# Patient Record
Sex: Female | Born: 1960 | Race: White | Hispanic: No | Marital: Single | Smoking: Current every day smoker
Health system: Southern US, Community
[De-identification: ages and names within clinical notes are randomized; demographics above are authoritative.]

## PROBLEM LIST (undated history)

## (undated) DIAGNOSIS — S42309A Unspecified fracture of shaft of humerus, unspecified arm, initial encounter for closed fracture: Secondary | ICD-10-CM

## (undated) DIAGNOSIS — E119 Type 2 diabetes mellitus without complications: Secondary | ICD-10-CM

## (undated) DIAGNOSIS — I10 Essential (primary) hypertension: Secondary | ICD-10-CM

## (undated) DIAGNOSIS — E1340 Other specified diabetes mellitus with diabetic neuropathy, unspecified: Secondary | ICD-10-CM

## (undated) HISTORY — PX: CYST EXCISION: SHX5701

## (undated) HISTORY — PX: ABDOMINAL HYSTERECTOMY: SHX81

---

## 2016-11-16 DIAGNOSIS — S42309A Unspecified fracture of shaft of humerus, unspecified arm, initial encounter for closed fracture: Secondary | ICD-10-CM

## 2016-11-16 HISTORY — DX: Unspecified fracture of shaft of humerus, unspecified arm, initial encounter for closed fracture: S42.309A

## 2016-11-26 ENCOUNTER — Emergency Department (HOSPITAL_COMMUNITY)
Admission: EM | Admit: 2016-11-26 | Discharge: 2016-11-26 | Disposition: A | Discharge status: Home / Self Care | Payer: Medicare Other | Source: Home / Self Care | Attending: Emergency Medicine | Admitting: Emergency Medicine

## 2016-11-26 ENCOUNTER — Encounter (HOSPITAL_COMMUNITY): Payer: Self-pay | Admitting: *Deleted

## 2016-11-26 ENCOUNTER — Emergency Department (HOSPITAL_COMMUNITY): Payer: Medicare Other

## 2016-11-26 DIAGNOSIS — E1100 Type 2 diabetes mellitus with hyperosmolarity without nonketotic hyperglycemic-hyperosmolar coma (NKHHC): Secondary | ICD-10-CM | POA: Diagnosis not present

## 2016-11-26 DIAGNOSIS — I10 Essential (primary) hypertension: Secondary | ICD-10-CM

## 2016-11-26 DIAGNOSIS — S42291A Other displaced fracture of upper end of right humerus, initial encounter for closed fracture: Secondary | ICD-10-CM | POA: Insufficient documentation

## 2016-11-26 DIAGNOSIS — E119 Type 2 diabetes mellitus without complications: Secondary | ICD-10-CM

## 2016-11-26 DIAGNOSIS — W01198A Fall on same level from slipping, tripping and stumbling with subsequent striking against other object, initial encounter: Secondary | ICD-10-CM | POA: Insufficient documentation

## 2016-11-26 DIAGNOSIS — F17200 Nicotine dependence, unspecified, uncomplicated: Secondary | ICD-10-CM | POA: Insufficient documentation

## 2016-11-26 DIAGNOSIS — Y939 Activity, unspecified: Secondary | ICD-10-CM

## 2016-11-26 DIAGNOSIS — Y999 Unspecified external cause status: Secondary | ICD-10-CM

## 2016-11-26 DIAGNOSIS — Y9289 Other specified places as the place of occurrence of the external cause: Secondary | ICD-10-CM

## 2016-11-26 DIAGNOSIS — R739 Hyperglycemia, unspecified: Secondary | ICD-10-CM | POA: Diagnosis not present

## 2016-11-26 HISTORY — DX: Type 2 diabetes mellitus without complications: E11.9

## 2016-11-26 HISTORY — DX: Essential (primary) hypertension: I10

## 2016-11-26 MED ORDER — HYDROCODONE-ACETAMINOPHEN 5-325 MG PO TABS: 1.0000 | ORAL_TABLET | ORAL | 0 refills | Status: DC | PRN

## 2016-11-26 MED ORDER — HYDROCODONE-ACETAMINOPHEN 5-325 MG PO TABS: 1.0000 | ORAL_TABLET | ORAL | 0 refills | Status: AC | PRN

## 2016-11-26 NOTE — ED Notes (Signed)
Patient given a prepackage of Norco 5/325 mg quantity six and instructions on use, patient verbally understands.

## 2016-11-26 NOTE — ED Provider Notes (Signed)
[facility]-EMERGENCY DEPT Provider Note   CSN: [id number] Arrival date & time: 11/26/16  2140     History   Chief Complaint Chief Complaint  Patient presents with  . Shoulder Pain    HPI Ashley Huff is a 56 y.o. right handed female presenting with injury to her right shoulder occurring just prior to arrival tonight.  She slipped on the top step of a wooden deck and fell onto the deck surface with her right arm behind her head.  She has had persistent pain in the right shoulder since.  There is no radiation of pain and she denies head or neck injury or pain.  She had no loc during the event and denies weakness or numbness in her right arm or hand. She has had no treatment prior to arriving here.  The history is provided by the patient and a relative.    Past Medical History:  Diagnosis Date  . Diabetes mellitus without complication (HCC)   . Hypertension     There are no active problems to display for this patient.   Past Surgical History:  Procedure Laterality Date  . ABDOMINAL HYSTERECTOMY      OB History    No data available       Home Medications    Prior to Admission medications   Medication Sig Start Date End Date Taking? Authorizing Provider  HYDROcodone-acetaminophen (NORCO/VICODIN) 5-325 MG tablet Take 1 tablet by mouth every 4 (four) hours as needed. 11/26/16   Burgess AmorIdol, Knoah Nedeau, PA-C    Family History No family history on file.  Social History Social History  Substance Use Topics  . Smoking status: Current Every Day Smoker  . Smokeless tobacco: Never Used  . Alcohol use No     Allergies   Patient has no known allergies.   Review of Systems Review of Systems  Constitutional: Negative for fever.  Musculoskeletal: Positive for arthralgias. Negative for back pain, joint swelling, myalgias and neck pain.  Neurological: Negative for weakness, numbness and headaches.     Physical Exam Updated Vital Signs BP (!) 145/68   Pulse (!) 116   Temp 98.3 F  (36.8 C) (Oral)   Resp 16   Ht 5\' 2"  (1.575 m)   Wt 84.4 kg (186 lb)   SpO2 95%   BMI 34.02 kg/m   Physical Exam  Constitutional: She appears well-developed and well-nourished.  HENT:  Head: Normocephalic and atraumatic.  Neck: Normal range of motion.  Cardiovascular:  Pulses:      Radial pulses are 2+ on the right side, and 2+ on the left side.  Pulses equal bilaterally  Musculoskeletal: She exhibits tenderness.       Right shoulder: She exhibits bony tenderness. She exhibits no swelling, no effusion, no crepitus, no spasm, normal pulse and normal strength.       Cervical back: Normal.       Thoracic back: Normal.       Lumbar back: Normal.  ttp right lateral and anterior shoulder, no deformity.  Bicep nontender, no right elbow or forearm pain.   Neurological: She is alert. She has normal strength. She displays normal reflexes. No sensory deficit.  Equal grip strength.  Skin: Skin is warm and dry.  Psychiatric: She has a normal mood and affect.     ED Treatments / Results  Labs (all labs ordered are listed, but only abnormal results are displayed) Labs Reviewed - No data to display  EKG  EKG Interpretation None  Radiology Dg Shoulder Right  Result Date: 11/26/2016 CLINICAL DATA:  Status post fall, with right shoulder pain. Initial encounter. EXAM: RIGHT SHOULDER - 2+ VIEW COMPARISON:  None. FINDINGS: There is a minimally displaced fracture involving the right humeral head and neck. Degenerative change is noted at the right acromioclavicular joint, with inferior osteophyte formation. Overlying soft tissue swelling is noted. The visualized portions of the right lung are clear. IMPRESSION: Minimally displaced fracture involving the right humeral head and neck. Electronically Signed   By: Roanna Raider M.D.   On: 11/26/2016 22:19    Procedures Procedures (including critical care time)  Medications Ordered in ED Medications - No data to display   Initial  Impression / Assessment and Plan / ED Course  I have reviewed the triage vital signs and the nursing notes.  Pertinent labs & imaging results that were available during my care of the patient were reviewed by me and considered in my medical decision making (see chart for details).     Pt with humeral neck fx. Neurovascular intact.  Placed in sling/immobilizer.  Ice packs, hydrocodone.  Referral to ortho for f/u care.    The patient appears reasonably screened and/or stabilized for discharge and I doubt any other medical condition or other [facility] requiring further screening, evaluation, or treatment in the ED at this time prior to discharge.   Final Clinical Impressions(s) / ED Diagnoses   Final diagnoses:  Closed fracture of head of right humerus, initial encounter    New Prescriptions New Prescriptions   HYDROCODONE-ACETAMINOPHEN (NORCO/VICODIN) 5-325 MG TABLET    Take 1 tablet by mouth every 4 (four) hours as needed.     Burgess Amor, PA-C 11/26/16 2330    Bethann Berkshire, MD 11/26/16 [phone]

## 2016-11-26 NOTE — ED Notes (Signed)
Pt ambulated to bathroom with minimal assistance.

## 2016-11-26 NOTE — Discharge Instructions (Signed)
Wear your sling at all times to protect your injury as discussed.  Ice applied as much as possible to the shoulder for the next several days will help with pain and swelling. You may take the hydrocodone prescribed for pain relief.  This will make you drowsy - do not drive within 4 hours of taking this medication.

## 2016-11-26 NOTE — ED Triage Notes (Signed)
Pt slipped on her porch falling with her right arm behind her, denies any LOC, denies hitting her head, c/o pain to right shoulder that radiates down right arm,

## 2016-11-27 ENCOUNTER — Inpatient Hospital Stay (HOSPITAL_COMMUNITY): Payer: Medicare Other

## 2016-11-27 ENCOUNTER — Encounter (HOSPITAL_COMMUNITY): Payer: Self-pay | Admitting: Student

## 2016-11-27 ENCOUNTER — Emergency Department (HOSPITAL_COMMUNITY): Payer: Medicare Other

## 2016-11-27 ENCOUNTER — Inpatient Hospital Stay (HOSPITAL_COMMUNITY)
Admission: EM | Admit: 2016-11-27 | Discharge: 2016-11-29 | DRG: 637 | Disposition: A | Discharge status: Home / Self Care | Payer: Medicare Other | Attending: Family Medicine | Admitting: Family Medicine

## 2016-11-27 DIAGNOSIS — F17210 Nicotine dependence, cigarettes, uncomplicated: Secondary | ICD-10-CM | POA: Diagnosis present

## 2016-11-27 DIAGNOSIS — R402142 Coma scale, eyes open, spontaneous, at arrival to emergency department: Secondary | ICD-10-CM | POA: Diagnosis present

## 2016-11-27 DIAGNOSIS — E871 Hypo-osmolality and hyponatremia: Secondary | ICD-10-CM | POA: Diagnosis present

## 2016-11-27 DIAGNOSIS — E860 Dehydration: Secondary | ICD-10-CM | POA: Diagnosis present

## 2016-11-27 DIAGNOSIS — E876 Hypokalemia: Secondary | ICD-10-CM | POA: Diagnosis present

## 2016-11-27 DIAGNOSIS — E1100 Type 2 diabetes mellitus with hyperosmolarity without nonketotic hyperglycemic-hyperosmolar coma (NKHHC): Secondary | ICD-10-CM | POA: Diagnosis present

## 2016-11-27 DIAGNOSIS — R402242 Coma scale, best verbal response, confused conversation, at arrival to emergency department: Secondary | ICD-10-CM | POA: Diagnosis present

## 2016-11-27 DIAGNOSIS — D649 Anemia, unspecified: Secondary | ICD-10-CM | POA: Diagnosis present

## 2016-11-27 DIAGNOSIS — I10 Essential (primary) hypertension: Secondary | ICD-10-CM | POA: Diagnosis present

## 2016-11-27 DIAGNOSIS — Z794 Long term (current) use of insulin: Secondary | ICD-10-CM

## 2016-11-27 DIAGNOSIS — G514 Facial myokymia: Secondary | ICD-10-CM

## 2016-11-27 DIAGNOSIS — R402362 Coma scale, best motor response, obeys commands, at arrival to emergency department: Secondary | ICD-10-CM | POA: Diagnosis present

## 2016-11-27 DIAGNOSIS — G9341 Metabolic encephalopathy: Secondary | ICD-10-CM | POA: Diagnosis present

## 2016-11-27 DIAGNOSIS — N179 Acute kidney failure, unspecified: Secondary | ICD-10-CM | POA: Diagnosis present

## 2016-11-27 DIAGNOSIS — Z90710 Acquired absence of both cervix and uterus: Secondary | ICD-10-CM

## 2016-11-27 DIAGNOSIS — S42301D Unspecified fracture of shaft of humerus, right arm, subsequent encounter for fracture with routine healing: Secondary | ICD-10-CM | POA: Diagnosis not present

## 2016-11-27 DIAGNOSIS — R739 Hyperglycemia, unspecified: Secondary | ICD-10-CM

## 2016-11-27 DIAGNOSIS — J449 Chronic obstructive pulmonary disease, unspecified: Secondary | ICD-10-CM | POA: Diagnosis present

## 2016-11-27 HISTORY — DX: Unspecified fracture of shaft of humerus, unspecified arm, initial encounter for closed fracture: S42.309A

## 2016-11-27 HISTORY — DX: Other specified diabetes mellitus with diabetic neuropathy, unspecified: E13.40

## 2016-11-27 LAB — CBC
HEMATOCRIT: 32.5 % — AB (ref 36.0–46.0)
Hemoglobin: 10.1 g/dL — ABNORMAL LOW (ref 12.0–15.0)
MCH: 25.8 pg — AB (ref 26.0–34.0)
MCHC: 31.1 g/dL (ref 30.0–36.0)
MCV: 83.1 fL (ref 78.0–100.0)
Platelets: 258 10*3/uL (ref 150–400)
RBC: 3.91 MIL/uL (ref 3.87–5.11)
RDW: 16.2 % — AB (ref 11.5–15.5)
WBC: 12.3 10*3/uL — ABNORMAL HIGH (ref 4.0–10.5)

## 2016-11-27 LAB — ETHANOL: Alcohol, Ethyl (B): <5 mg/dL (ref <5)

## 2016-11-27 LAB — CBG MONITORING, ED
GLUCOSE-CAPILLARY: 474 mg/dL — AB (ref 65–99)
Glucose-Capillary: >600 mg/dL (ref 65–99)
Glucose-Capillary: >600 mg/dL (ref 65–99)
Glucose-Capillary: 385 mg/dL — ABNORMAL HIGH (ref 65–99)
Glucose-Capillary: 270 mg/dL — ABNORMAL HIGH (ref 65–99)
Glucose-Capillary: 217 mg/dL — ABNORMAL HIGH (ref 65–99)

## 2016-11-27 LAB — I-STAT CHEM 8, ED
BUN: 10 mg/dL (ref 6–20)
CALCIUM ION: 1.06 mmol/L — AB (ref 1.15–1.40)
CREATININE: 1.7 mg/dL — AB (ref 0.44–1.00)
Chloride: 78 mmol/L — ABNORMAL LOW (ref 101–111)
Glucose, Bld: >700 mg/dL (ref 65–99)
HEMATOCRIT: 35 % — AB (ref 36.0–46.0)
HEMOGLOBIN: 11.9 g/dL — AB (ref 12.0–15.0)
Potassium: 3.4 mmol/L — ABNORMAL LOW (ref 3.5–5.1)
Sodium: 120 mmol/L — ABNORMAL LOW (ref 135–145)
TCO2: 28 mmol/L (ref 0–100)

## 2016-11-27 LAB — COMPREHENSIVE METABOLIC PANEL
ALBUMIN: 3.2 g/dL — AB (ref 3.5–5.0)
ALK PHOS: 289 U/L — AB (ref 38–126)
ALT: 16 U/L (ref 14–54)
AST: 18 U/L (ref 15–41)
Anion gap: 14 (ref 5–15)
BILIRUBIN TOTAL: 0.3 mg/dL (ref 0.3–1.2)
BUN: 9 mg/dL (ref 6–20)
CALCIUM: 8.7 mg/dL — AB (ref 8.9–10.3)
CO2: 25 mmol/L (ref 22–32)
CREATININE: 1.46 mg/dL — AB (ref 0.44–1.00)
Chloride: 78 mmol/L — ABNORMAL LOW (ref 101–111)
GFR calc non Af Amer: 39 mL/min — ABNORMAL LOW (ref >60)
GFR, EST AFRICAN AMERICAN: 45 mL/min — AB (ref >60)
Glucose, Bld: 1171 mg/dL (ref 65–99)
Potassium: 3.2 mmol/L — ABNORMAL LOW (ref 3.5–5.1)
Sodium: 117 mmol/L — CL (ref 135–145)
TOTAL PROTEIN: 7.2 g/dL (ref 6.5–8.1)

## 2016-11-27 LAB — DIFFERENTIAL
BASOS ABS: 0 10*3/uL (ref 0.0–0.1)
BASOS PCT: 0 %
Eosinophils Absolute: 0 10*3/uL (ref 0.0–0.7)
Eosinophils Relative: 0 %
LYMPHS ABS: 0.9 10*3/uL (ref 0.7–4.0)
LYMPHS PCT: 8 %
MONO ABS: 0.7 10*3/uL (ref 0.1–1.0)
MONOS PCT: 6 %
NEUTROS ABS: 10.6 10*3/uL — AB (ref 1.7–7.7)
Neutrophils Relative %: 86 %

## 2016-11-27 LAB — URINALYSIS, ROUTINE W REFLEX MICROSCOPIC
BACTERIA UA: NONE SEEN
Bilirubin Urine: NEGATIVE
Glucose, UA: >=500 mg/dL — AB
KETONES UR: NEGATIVE mg/dL
Leukocytes, UA: NEGATIVE
Nitrite: NEGATIVE
PH: 6 (ref 5.0–8.0)
Protein, ur: NEGATIVE mg/dL
RBC / HPF: NONE SEEN RBC/hpf (ref 0–5)
SPECIFIC GRAVITY, URINE: 1.025 (ref 1.005–1.030)

## 2016-11-27 LAB — RAPID URINE DRUG SCREEN, HOSP PERFORMED
AMPHETAMINES: NOT DETECTED
BARBITURATES: NOT DETECTED
BENZODIAZEPINES: NOT DETECTED
COCAINE: NOT DETECTED
OPIATES: POSITIVE — AB
TETRAHYDROCANNABINOL: NOT DETECTED

## 2016-11-27 LAB — BASIC METABOLIC PANEL
ANION GAP: 9 (ref 5–15)
BUN: 5 mg/dL — ABNORMAL LOW (ref 6–20)
CHLORIDE: 93 mmol/L — AB (ref 101–111)
CO2: 30 mmol/L (ref 22–32)
CREATININE: 0.95 mg/dL (ref 0.44–1.00)
Calcium: 8.8 mg/dL — ABNORMAL LOW (ref 8.9–10.3)
GFR calc non Af Amer: >60 mL/min (ref >60)
Glucose, Bld: 229 mg/dL — ABNORMAL HIGH (ref 65–99)
Potassium: 2.3 mmol/L — CL (ref 3.5–5.1)
SODIUM: 132 mmol/L — AB (ref 135–145)

## 2016-11-27 LAB — APTT: aPTT: 26 seconds (ref 24–36)

## 2016-11-27 LAB — I-STAT TROPONIN, ED: Troponin i, poc: 0.02 ng/mL (ref 0.00–0.08)

## 2016-11-27 LAB — I-STAT ARTERIAL BLOOD GAS, ED
ACID-BASE EXCESS: 3 mmol/L — AB (ref 0.0–2.0)
BICARBONATE: 27.8 mmol/L (ref 20.0–28.0)
O2 SAT: 94 %
TCO2: 29 mmol/L (ref 0–100)
pCO2 arterial: 44.5 mmHg (ref 32.0–48.0)
pH, Arterial: 7.404 (ref 7.350–7.450)
pO2, Arterial: 69 mmHg — ABNORMAL LOW (ref 83.0–108.0)

## 2016-11-27 LAB — PROTIME-INR
INR: 1.04
Prothrombin Time: 13.6 seconds (ref 11.4–15.2)

## 2016-11-27 LAB — GLUCOSE, CAPILLARY
GLUCOSE-CAPILLARY: 259 mg/dL — AB (ref 65–99)
GLUCOSE-CAPILLARY: 280 mg/dL — AB (ref 65–99)
GLUCOSE-CAPILLARY: 243 mg/dL — AB (ref 65–99)
Glucose-Capillary: 255 mg/dL — ABNORMAL HIGH (ref 65–99)

## 2016-11-27 LAB — PHOSPHORUS: PHOSPHORUS: 2.1 mg/dL — AB (ref 2.5–4.6)

## 2016-11-27 LAB — MAGNESIUM: MAGNESIUM: 1.8 mg/dL (ref 1.7–2.4)

## 2016-11-27 LAB — TSH: TSH: 0.993 u[IU]/mL (ref 0.350–4.500)

## 2016-11-27 LAB — OSMOLALITY: Osmolality: 287 mOsm/kg (ref 275–295)

## 2016-11-27 MED ORDER — ACETAMINOPHEN 650 MG RE SUPP: 650.0000 mg | Freq: Four times a day (QID) | RECTAL | Status: DC | PRN

## 2016-11-27 MED ORDER — POTASSIUM CHLORIDE 10 MEQ/100ML IV SOLN
10.0000 meq | INTRAVENOUS | Status: AC
  Administered 2016-11-27 – 2016-11-28 (×6): 10 meq via INTRAVENOUS
  Filled 2016-11-27 (×5): qty 100

## 2016-11-27 MED ORDER — IPRATROPIUM-ALBUTEROL 0.5-2.5 (3) MG/3ML IN SOLN: 3.0000 mL | Freq: Four times a day (QID) | RESPIRATORY_TRACT | Status: DC | PRN

## 2016-11-27 MED ORDER — HYDRALAZINE HCL 20 MG/ML IJ SOLN
10.0000 mg | INTRAMUSCULAR | Status: DC | PRN
  Administered 2016-11-28: 10 mg via INTRAVENOUS
  Filled 2016-11-27 (×2): qty 1

## 2016-11-27 MED ORDER — DEXTROSE-NACL 5-0.45 % IV SOLN
INTRAVENOUS | Status: DC
  Administered 2016-11-27: 21:00:00 via INTRAVENOUS

## 2016-11-27 MED ORDER — POLYETHYLENE GLYCOL 3350 17 G PO PACK: 17.0000 g | PACK | Freq: Every day | ORAL | Status: DC | PRN

## 2016-11-27 MED ORDER — ACETAMINOPHEN 325 MG PO TABS: 650.0000 mg | ORAL_TABLET | Freq: Four times a day (QID) | ORAL | Status: DC | PRN

## 2016-11-27 MED ORDER — INSULIN REGULAR HUMAN 100 UNIT/ML IJ SOLN
INTRAMUSCULAR | Status: DC
  Administered 2016-11-27: 13:00:00 via INTRAVENOUS
  Filled 2016-11-27 (×2): qty 1

## 2016-11-27 MED ORDER — ENOXAPARIN SODIUM 40 MG/0.4ML ~~LOC~~ SOLN
40.0000 mg | SUBCUTANEOUS | Status: DC
  Administered 2016-11-27 – 2016-11-28 (×2): 40 mg via SUBCUTANEOUS
  Filled 2016-11-27 (×3): qty 0.4

## 2016-11-27 MED ORDER — INSULIN GLARGINE 100 UNIT/ML ~~LOC~~ SOLN
5.0000 [IU] | Freq: Every day | SUBCUTANEOUS | Status: DC
  Administered 2016-11-27: 5 [IU] via SUBCUTANEOUS
  Filled 2016-11-27: qty 0.05

## 2016-11-27 MED ORDER — ONDANSETRON HCL 4 MG/2ML IJ SOLN: 4.0000 mg | Freq: Four times a day (QID) | INTRAMUSCULAR | Status: DC | PRN

## 2016-11-27 MED ORDER — INSULIN ASPART 100 UNIT/ML ~~LOC~~ SOLN
0.0000 [IU] | SUBCUTANEOUS | Status: DC
  Administered 2016-11-28: 11 [IU] via SUBCUTANEOUS
  Administered 2016-11-28: 15 [IU] via SUBCUTANEOUS
  Administered 2016-11-28: 8 [IU] via SUBCUTANEOUS
  Administered 2016-11-28: 2 [IU] via SUBCUTANEOUS

## 2016-11-27 MED ORDER — IPRATROPIUM-ALBUTEROL 0.5-2.5 (3) MG/3ML IN SOLN: 3.0000 mL | Freq: Four times a day (QID) | RESPIRATORY_TRACT | Status: DC

## 2016-11-27 MED ORDER — POTASSIUM CHLORIDE CRYS ER 20 MEQ PO TBCR
40.0000 meq | EXTENDED_RELEASE_TABLET | Freq: Once | ORAL | Status: AC
  Administered 2016-11-27: 40 meq via ORAL
  Filled 2016-11-27: qty 2

## 2016-11-27 MED ORDER — SODIUM CHLORIDE 0.9 % IV BOLUS (SEPSIS)
1000.0000 mL | Freq: Once | INTRAVENOUS | Status: AC
  Administered 2016-11-27: 1000 mL via INTRAVENOUS

## 2016-11-27 MED ORDER — ONDANSETRON HCL 4 MG PO TABS: 4.0000 mg | ORAL_TABLET | Freq: Four times a day (QID) | ORAL | Status: DC | PRN

## 2016-11-27 MED ORDER — SODIUM CHLORIDE 0.9 % IV SOLN
INTRAVENOUS | Status: DC
  Administered 2016-11-27: 15:00:00 via INTRAVENOUS

## 2016-11-27 MED ORDER — SODIUM CHLORIDE 0.9% FLUSH
3.0000 mL | Freq: Two times a day (BID) | INTRAVENOUS | Status: DC
  Administered 2016-11-28 – 2016-11-29 (×3): 3 mL via INTRAVENOUS

## 2016-11-27 NOTE — ED Provider Notes (Addendum)
Patient brought by EMS she reports dry mouth since this morning. EMS reports mouth droop since awakening this morning. Blood sugar was read as "high" by EMS. On exam patient is alert mucous membranes dry HEENT exam no facial asymmetry. She does have twitching of her mouth which she states has been going on for approximately 20 years she also has twitching of right arm which she states has been going on for approximately 20 years. Lungs clear auscultation heart regular rate and rhythm patient was [facility] department after she tripped on a wooden deck and fell. She was determined to have a fracture of proximal right humeral head, prescribed Vicodin   Doug SouJacubowitz, Asya Derryberry, MD 11/27/16 1142 Insulin drip ordered by glucose stabilizer, as well is iv norma saline iv bolus,. Pt clinically dehydrated . Will be admitted bolus as is in diabetic hyperosmolar non ketotic state CRITICAL CARE Performed by: Doug SouJACUBOWITZ,Kayln Garceau Total critical care time: 30 minutes Critical care time was exclusive of separately billable procedures and treating other patients. Critical care was necessary to treat or prevent imminent or life-threatening deterioration. Critical care was time spent personally by me on the following activities: development of treatment plan with patient and/or surrogate as well as nursing, discussions with consultants, evaluation of patient's response to treatment, examination of patient, obtaining history from patient or surrogate, ordering and performing treatments and interventions, ordering and review of laboratory studies, ordering and review of radiographic studies, pulse oximetry and re-evaluation of patient's condition.   Doug SouJacubowitz, Theron Cumbie, MD 11/28/16 1341

## 2016-11-27 NOTE — ED Notes (Signed)
CBG read HI. 

## 2016-11-27 NOTE — ED Notes (Signed)
Patient transported to CT 

## 2016-11-27 NOTE — Progress Notes (Signed)
Orthopedic Tech Progress Note Patient Details:  Ashley JarredSandra Huff Aug 26, 1960 [id number]  Ortho Devices Type of Ortho Device: Arm sling Ortho Device/Splint Location: rue arm elevator sling Ortho Device/Splint Interventions: Ordered, Application, Adjustment   Trinna PostMartinez, Tallis Soledad J 11/27/2016, 8:52 PM

## 2016-11-27 NOTE — ED Notes (Signed)
Pt placed on purewick for urinary uses. Pt unable to walk to restroom at this time.

## 2016-11-27 NOTE — ED Notes (Signed)
Ashley QuailsMichael Huff - son - [phone]. Call when bed is assigned.

## 2016-11-27 NOTE — Progress Notes (Signed)
FPTS Interim Progress Note  S: Went to bedside to evaluate patient after she arrived to the floor. Patient is sleepy but arousable. When patient speaks her speech sounds clearer than when she was in the ED. Additionally not having a left-sided facial twitch as before. No complaints.   O: BP (!) 186/88 (BP Location: Right Arm)   Pulse (!) 115   Temp 98.2 F (36.8 C) (Oral)   Resp (!) 21   Ht 5\' 2"  (1.575 m)   Wt 189 lb 6 oz (85.9 kg)   SpO2 92%   BMI 34.64 kg/m   General: sleepy but easily aroused Resp: normal work of breathing Neuro: oriented to person, place, and year  A/P: HHS: We'll continue monitoring throughout the night. We will take off insulin drip and transition to subcutaneous insulin tonight as her glucoses are in the low 200s now. Start at 5 units Lantus and moderate SSI with q 4 CBGs.   Hypokalemia: K very low at 2.3 on recent BMP. Will need to supplement this urgently. RN will try and do swallow screen. If she can tolerate PO, will do PO KDUR. Will also order 6 runs of 10 meq KCl. EKG ordered.   AMS/slurred speech: Seems improved now that glucose are under better control. Would like to continue q4 neuro checks for now. Swallow screen as above. Appreciate excellent RN care.    Beaulah DinningGambino, Eduar Kumpf M, MD 11/27/2016, 10:37 PM PGY-3, [facility] pager [phone]

## 2016-11-27 NOTE — ED Triage Notes (Signed)
[facility] c/c of altered mental status[facility] having [facility] speaking. Pt was seen last in an unknown ED night for fall with right shoulder involvement, per EMS. Pt was prescribed hydrocodone for pain and was last taken this AM at 0445, per EMS. Pt has hx of DM and HTN. Pt is alert and oriented x4. EMS Bp - 190/100, sp02 100% RA, pulse 83. CBG with EMS was "high" (500+).

## 2016-11-27 NOTE — ED Provider Notes (Signed)
[facility]-EMERGENCY DEPT Provider Note   CSN: [id number] Arrival date & time: 11/27/16  1122     History   Chief Complaint Chief Complaint  Patient presents with  . Altered Mental Status    HPI Ashley Huff is a 56 y.o. female brought in by EMS as her son was concerned that she is getting worse.  Level V caveat, as patient is poor historian and occasionally will not respond to questions.  Patient states that her son called EMS for her to be evaluated, as she was 'getting worse.' She was unable to vocalize what was worse. She denies any pain. Her biggest complaint is right shoulder pain and dry mouth. Patient fell yesterday, and was evaluated Ashley Huff and diagnosed with humeral shaft fracture. She denies head pain or loss of consciousness. Patient states she is taking blood thinners, but does not know the name of it. Blood thinner possibly is 1 baby aspirin daily. There is no record of blood thinners in our chart. Patient reports the twitching of her face and arm have been present for the past 20 years. She denies fevers, chills, cough, shortness of breath, abdominal pain, urinary symptoms, or abnormal bowel movements. Per EMS, patient has a history of diabetes and hypertension. CBG was high on initial evaluation. Per chart review, fall last night was mechanical. Patient denies hitting her head or loss of consciousness.  On reassessment, family was in the room and discuss further. Daughter-in-law states that patient is mentating per baseline, and does not appear more confused than normal. She confirms that the tremor in the right hand is baseline, but states that the twitching in her left cheek is new. Daughter states that pt speech was slurred and R face was drooping hen she woke up this morning. These sxs have resolved since arrival to the ED. Last Norco was taken at 4:45 this morning.    HPI  Past Medical History:  Diagnosis Date  . Diabetes mellitus without complication (HCC)   .  Hypertension     There are no active problems to display for this patient.   Past Surgical History:  Procedure Laterality Date  . ABDOMINAL HYSTERECTOMY      OB History    No data available       Home Medications    Prior to Admission medications   Medication Sig Start Date End Date Taking? Authorizing Provider  HYDROcodone-acetaminophen (NORCO/VICODIN) 5-325 MG tablet Take 1 tablet by mouth every 4 (four) hours as needed. 11/26/16   Burgess Amor, PA-C  HYDROcodone-acetaminophen (NORCO/VICODIN) 5-325 MG tablet Take 1 tablet by mouth every 4 (four) hours as needed. 11/26/16   Burgess Amor, PA-C    Family History No family history on file.  Social History Social History  Substance Use Topics  . Smoking status: Current Every Day Smoker  . Smokeless tobacco: Never Used  . Alcohol use No     Allergies   Patient has no known allergies.   Review of Systems Review of Systems  Unable to perform ROS: Mental status change     Physical Exam Updated Vital Signs BP (!) 187/80   Pulse (!) 109   Temp 98.5 F (36.9 C)   Resp 16   Physical Exam  Constitutional: She is oriented to person, place, and time. She appears well-developed and well-nourished. No distress.  HENT:  Head: Normocephalic and atraumatic.  Mouth/Throat: Uvula is midline and oropharynx is clear and moist. Mucous membranes are not pale and dry.  Eyes: Pupils are  equal, round, and reactive to light. Conjunctivae and EOM are normal.  no nystagmus  Neck: Normal range of motion. Neck supple.  Cardiovascular: Normal rate, regular rhythm and intact distal pulses.   Pulmonary/Chest: Effort normal and breath sounds normal. No respiratory distress. She has no wheezes. She has no rales.  Abdominal: Soft. Bowel sounds are normal. She exhibits no distension and no mass. There is no tenderness. There is no guarding.  Musculoskeletal: Normal range of motion.  Lymphadenopathy:    She has no cervical adenopathy.    Neurological: She is alert and oriented to person, place, and time. She has normal strength and normal reflexes. She displays tremor. No cranial nerve deficit or sensory deficit. She displays a negative Romberg sign. GCS eye subscore is 4. GCS verbal subscore is 4. GCS motor subscore is 6.  Cranial nerves intact. No sensory deficit. Strength of upper and lower extremities equal bilaterally. Patient has tremor of right arm and left cheek, which she reports has been present for many years. On nose to finger evaluation, intact front and L and abnormal to the R. No other neurologic deficit found.   Skin: Skin is warm and dry.  Nursing note and vitals reviewed.    ED Treatments / Results  Labs (all labs ordered are listed, but only abnormal results are displayed) Labs Reviewed  CBC - Abnormal; Notable for the following:       Result Value   WBC 12.3 (*)    Hemoglobin 10.1 (*)    HCT 32.5 (*)    MCH 25.8 (*)    RDW 16.2 (*)    All other components within normal limits  URINALYSIS, ROUTINE W REFLEX MICROSCOPIC - Abnormal; Notable for the following:    Color, Urine COLORLESS (*)    Glucose, UA >=500 (*)    Hgb urine dipstick SMALL (*)    Squamous Epithelial / LPF 0-5 (*)    All other components within normal limits  COMPREHENSIVE METABOLIC PANEL - Abnormal; Notable for the following:    Sodium 117 (*)    Potassium 3.2 (*)    Chloride 78 (*)    Glucose, Bld 1,171 (*)    Creatinine, Ser 1.46 (*)    Calcium 8.7 (*)    Albumin 3.2 (*)    Alkaline Phosphatase 289 (*)    GFR calc non Af Amer 39 (*)    GFR calc Af Amer 45 (*)    All other components within normal limits  DIFFERENTIAL - Abnormal; Notable for the following:    Neutro Abs 10.6 (*)    All other components within normal limits  RAPID URINE DRUG SCREEN, HOSP PERFORMED - Abnormal; Notable for the following:    Opiates POSITIVE (*)    All other components within normal limits  BASIC METABOLIC PANEL - Abnormal; Notable for the  following:    Sodium 132 (*)    Potassium 2.3 (*)    Chloride 93 (*)    Glucose, Bld 229 (*)    BUN 5 (*)    Calcium 8.8 (*)    All other components within normal limits  PHOSPHORUS - Abnormal; Notable for the following:    Phosphorus 2.1 (*)    All other components within normal limits  GLUCOSE, CAPILLARY - Abnormal; Notable for the following:    Glucose-Capillary 259 (*)    All other components within normal limits  GLUCOSE, CAPILLARY - Abnormal; Notable for the following:    Glucose-Capillary 255 (*)    All  other components within normal limits  GLUCOSE, CAPILLARY - Abnormal; Notable for the following:    Glucose-Capillary 280 (*)    All other components within normal limits  CBG MONITORING, ED - Abnormal; Notable for the following:    Glucose-Capillary >600 (*)    All other components within normal limits  I-STAT CHEM 8, ED - Abnormal; Notable for the following:    Sodium 120 (*)    Potassium 3.4 (*)    Chloride 78 (*)    Creatinine, Ser 1.70 (*)    Glucose, Bld >700 (*)    Calcium, Ion 1.06 (*)    Hemoglobin 11.9 (*)    HCT 35.0 (*)    All other components within normal limits  CBG MONITORING, ED - Abnormal; Notable for the following:    Glucose-Capillary >600 (*)    All other components within normal limits  I-STAT ARTERIAL BLOOD GAS, ED - Abnormal; Notable for the following:    pO2, Arterial 69.0 (*)    Acid-Base Excess 3.0 (*)    All other components within normal limits  CBG MONITORING, ED - Abnormal; Notable for the following:    Glucose-Capillary 474 (*)    All other components within normal limits  CBG MONITORING, ED - Abnormal; Notable for the following:    Glucose-Capillary 385 (*)    All other components within normal limits  CBG MONITORING, ED - Abnormal; Notable for the following:    Glucose-Capillary 270 (*)    All other components within normal limits  CBG MONITORING, ED - Abnormal; Notable for the following:    Glucose-Capillary 217 (*)    All  other components within normal limits  ETHANOL  PROTIME-INR  APTT  OSMOLALITY  TSH  MAGNESIUM  BLOOD GAS, ARTERIAL  HIV ANTIBODY (ROUTINE TESTING)  HEMOGLOBIN A1C  BASIC METABOLIC PANEL  BASIC METABOLIC PANEL  BASIC METABOLIC PANEL  BASIC METABOLIC PANEL  I-STAT TROPONIN, ED    EKG  EKG Interpretation  Date/Time:  Sunday November 27 2016 11:28:39 EDT Ventricular Rate:  108 PR Interval:    QRS Duration: 89 QT Interval:  316 QTC Calculation: 424 R Axis:   85 Text Interpretation:  Sinus tachycardia Probable left atrial enlargement Probable LVH with secondary repol abnrm Baseline wander in lead(s) V2 No old tracing to compare Confirmed by Gladstone, Doreatha Martin [phone]) on 11/27/2016 11:53:27 AM       Radiology Dg Shoulder Right  Result Date: 11/26/2016 CLINICAL DATA:  Status post fall, with right shoulder pain. Initial encounter. EXAM: RIGHT SHOULDER - 2+ VIEW COMPARISON:  None. FINDINGS: There is a minimally displaced fracture involving the right humeral head and neck. Degenerative change is noted at the right acromioclavicular joint, with inferior osteophyte formation. Overlying soft tissue swelling is noted. The visualized portions of the right lung are clear. IMPRESSION: Minimally displaced fracture involving the right humeral head and neck. Electronically Signed   By: Roanna Raider M.D.   On: 11/26/2016 22:19    Procedures Procedures (including critical care time)  Medications Ordered in ED Medications  enoxaparin (LOVENOX) injection 40 mg (40 mg Subcutaneous Given 11/27/16 2237)  sodium chloride flush (NS) 0.9 % injection 3 mL (not administered)  acetaminophen (TYLENOL) tablet 650 mg (not administered)    Or  acetaminophen (TYLENOL) suppository 650 mg (not administered)  polyethylene glycol (MIRALAX / GLYCOLAX) packet 17 g (not administered)  ondansetron (ZOFRAN) tablet 4 mg (not administered)    Or  ondansetron (ZOFRAN) injection 4 mg (not administered)  hydrALAZINE  (APRESOLINE)  injection 10 mg (not administered)  ipratropium-albuterol (DUONEB) 0.5-2.5 (3) MG/3ML nebulizer solution 3 mL (not administered)  potassium chloride 10 mEq in 100 mL IVPB (10 mEq Intravenous New Bag/Given 11/27/16 2248)  insulin glargine (LANTUS) injection 5 Units (not administered)  insulin aspart (novoLOG) injection 0-15 Units (not administered)  sodium chloride 0.9 % bolus 1,000 mL (0 mLs Intravenous Stopped 11/27/16 1634)  potassium chloride SA (K-DUR,KLOR-CON) CR tablet 40 mEq (40 mEq Oral Given 11/27/16 2230)     Initial Impression / Assessment and Plan / ED Course  I have reviewed the triage vital signs and the nursing notes.  Pertinent labs & imaging results that were available during my care of the patient were reviewed by me and considered in my medical decision making (see chart for details).     Pt presenting with concerns for possible speech slurring and facial droop. Physical exam shows pt is able to initially answer questions, but will slur her words as her answer continues, or refuse to answer the question as she states her mouth is dry. Physical exam shows no facial droop, no pronator drift, and equal grip strength bilaterally. Pt neurologically intact except inability to touch finger to nose when reaching towards the R. L facial twitching intermittent. Will start work up to r/o stroke.   Labs show pt with HHS. No anion gap and normal bicarb. UDS + for opioids, which pt was rx'd last night. CBC shows elevated WBC and mild anemia. No baseline labs to compare. EKG reassuring. UA shows >500 glucose, no signs of infection. Will start IVF and insulin gtt. Discussed case with attending, and Dr. Ethelda Chick evaluated the pt. Will contact hospitalist for admission.   Discussed with Dr. Jonathon Jordan, and pt to be admitted. CT head has not yet resulted.    Final Clinical Impressions(s) / ED Diagnoses   Final diagnoses:  Hyperglycemia  AKI (acute kidney injury) (HCC)  Facial  twitching  Closed fracture of shaft of right humerus with routine healing, unspecified fracture morphology, subsequent encounter    New Prescriptions Current Discharge Medication List       Alveria Apley, PA-C 11/27/16 2321    Doug Sou, MD 11/28/16 1344

## 2016-11-27 NOTE — H&P (Signed)
Family Medicine Teaching [facility] Admission History and Physical Service Pager: [phone]  Patient name: Ashley Huff Medical record number: [id number] Date of birth: 03/17/1961 Age: 56 y.o. Gender: female  Primary Care Provider: Patient, [name] Consultants: None  Code Status: Full per discussion on admission  Chief Complaint: Change in speech  Assessment and Plan: Ashley Huff is a 56 y.o. female presenting with change in speech and hyperglycemia . PMH is significant for type 2 diabetes and hypertension.  ?AMS/Change in Speech: Abnormal vitals on admission with hypertension to 195/91, tachycardia to 111, afebrile, somewhat increased respiratory rate but no increased oxygen requirement. Labs significant for extremely high glucose of 1171 on initial presentation to ED. Normal bicarbonate and no anion gap. ABG with normal pH. Slight leukocytosis to 12.3 with hemoglobin of 10.1. K low at 3.4. Troponin negative 1. EKG showing sinus tachycardia and no ST changes. UA collected showing greater than 500 glucose but no ketones, no nitrates or leukocytes. UDS showing positive opioids (she was prescribed Norco last night). Alcohol level nondetectable. Neurological exam showing twitching of the left cheek and tremor of right hand. Possible left-sided deviation of tongue. Unable to move right arm very much secondary to pain from fracture. Otherwise neurological exam appears intact. CT head performed showing no signs of CVA but chronic ischemic changes and atrophy. Less likely acute CVA. AMS and speech changes possibly due to HHS versus overusing pain medications versus hypothyroidism versus pulmonary infection. -Admit to family medicine teaching service, stepdown unit, attending Dr. Pollie MeyerMcIntyre -Neuro checks every 4 -Vital signs per floor protocol -We'll obtain TSH, mag, phos -We'll treat for HHS (see below assessment and plan) -Chest x-ray -Nothing by mouth for now, would like RN swallow eval -Hold  sedating medications for now -May need to involve neuro and/order brain MRI if she does not improve with improved glucose control  HHS w/ Hx of T2DM: High glucose of 1171 on initial presentation to ED. Normal bicarbonate and no anion gap. ABG with normal pH. UA collected showing greater than 500 glucose but no ketones, no nitrates or leukocytes. Takes insulin at home, unknown doses.  - Insulin gtt per protocol - q1 CBGs - q 4 BMPs - obtain plasma osmolality  - Can transition to sub q insulin when glucose <250  Hypertension: Unclear she has history of hypertension at home. BP elevated on admission to 195/91 -10 mg hydralazine when necessary for systolic blood pressure greater than 180 and diastolic greater than 110 -Can consider adding an antihypertensive agent if she continues to be hypertensive  Hypokalemia: K 3.4 - supplement with KDUR  Hyponatremia: Sodium of 120 upon initial presentation but 127 when corrected for hyperglycemia -Continue to monitor with every 4 BMP  Acute kidney injury: Unknown baseline creatinine. Creatinine 1.7 on admission. Likely prerenal secondary to volume loss with uncontrolled hyperglycemia. Status post 1 L -IV fluids as above  Wheezing: Chronic tobacco use, likely has COPD. Unclear if patient has worsening respiratory status on exam today or if this is her baseline. Is unable to tell me if she has inhalers at home. We'll need to rule out respiratory infection. Reassuringly patient afebrile. -Obtain chest x-ray -Duo neb 1 now -Duo nebs every 6 when necessary  Right closed humeral neck fracture: Found on 8/11. Was told to follow up with ortho in outpatient setting - Holding home Norco in setting of AMS -Spoke with on-call orthopedic Dr. Rayburn MaBlackmon about fracture, he said nothing to do an inpatient setting. She will need a right  arm sling.  Tobacco Abuse: Smokes 1 pack per day -Tobacco cessation counseling when mental status improves -Can offer nicotine  patch if she desires  FEN/GI: Nothing by mouth for now until passes RN swallow screen, fluids per Glucomander protocol Prophylaxis: Lovenox  Disposition: Admit to SDU  History of Present Illness:  Ashley Huff is a 56 y.o. female presenting with slurred speech and hyperglycemia:   No family at bedside on admission. Patient difficult to understand as she is intermittently slurring her words and becomes incomprehensible at times.  Patient notes that she thought that she was having a stroke this morning because she woke up and wasn't "speaking right". She notified her son and daughter-in-law who took her to the hospital. She notes that she is slurring her words and is having difficulty with word finding. Denies any previous strokes. Denies any headache, chest pain, shortness of breath. Denies any weakness, numbness, tingling.  Of note patient was seen in the ED last night for a right humeral neck fracture. Per Epic review, patient slipped on a top step of a wooden deck and fell on her right shoulder. She was given prescription for Norco and instructed to follow-up with outpatient orthopedics. Patient states that she did hit her head when she fell yesterday but did not lose consciousness.  Unable to tell me which medications she takes at home or when she last took the Norco. She is able to tell me that she has a medical history of hypertension and diabetes, cannot tell me she takes medications for this.  ED course: Patient noted to have glucose greater than 1000, she was given 1 L bolus placed on insulin drip. CT head was done but not read on admission. Troponin negative 1. EKG showing sinus tachycardia and no ST changes. UA collected showing greater than 500 glucose but no ketones, no nitrates or leukocytes. UDS showing positive opioids (she was prescribed Norco last night). Alcohol level nondetectable.   Review Of Systems: Per HPI with the following additions: see HPI  ROS  There are no active  problems to display for this patient.   Past Medical History: Past Medical History:  Diagnosis Date  . Diabetes mellitus without complication (HCC)   . Hypertension     Past Surgical History: Past Surgical History:  Procedure Laterality Date  . ABDOMINAL HYSTERECTOMY      Social History: Social History  Substance Use Topics  . Smoking status: Current Every Day Smoker  . Smokeless tobacco: Never Used  . Alcohol use No   Additional social history: Smokes about a half pack per day. Lives with her son.  Please also refer to relevant sections of EMR.  Family History: No family history on file. Patient does not know  Allergies and Medications: No Known Allergies No current facility-administered medications on file prior to encounter.    Current Outpatient Prescriptions on File Prior to Encounter  Medication Sig Dispense Refill  . HYDROcodone-acetaminophen (NORCO/VICODIN) 5-325 MG tablet Take 1 tablet by mouth every 4 (four) hours as needed. 6 tablet 0  . HYDROcodone-acetaminophen (NORCO/VICODIN) 5-325 MG tablet Take 1 tablet by mouth every 4 (four) hours as needed. 20 tablet 0  Patient does not know  Objective: BP (!) 195/91   Pulse (!) 111   Temp 98.5 F (36.9 C)   Resp 19  Exam: General: Alert, in NAD,  Eyes: Pupils myotic but reactive to light and equal, slight proptosis bilaterally, extraocular movements intact, nonicteric sclera ENTM: Dry mucous membranes, no teeth Neck:  No lymphadenopathy Cardiovascular: Tachycardic rate, regular rhythm, normal S1-S2, no murmurs appreciated, no peripheral edema, palpable DP pulses bilaterally Respiratory: Slight increased respiratory rate, no accessory muscle usage, audible inspiratory and expiratory wheezing throughout, no crackles appreciated  Gastrointestinal: Normal bowel sounds, soft, nondistended, nontender MSK: Right shoulder and proximal arm tender to palpation, does not move this arm secondary to pain but can move her  right hand. Moves all other extremities spontaneously without difficulty Derm: No rashes Neuro: Alert but only oriented to self and place. Says the year as 64. Does not remember the president. Somewhat slurred speech and intermittent babbling, may have slight left deviation of tongue but difficult to examine.  Otherwise cranial nerves intact. Intermittent left-sided facial twitching and right hand tremor. 5 out of 5 strength in left upper extremity and bilateral lower extremities. 5 out of 5 grip strength of right hand but unable to move right arm. Sensation grossly intact throughout  Psych: normal mood but does appear to be somewhat altered. not responding to any internal stimuli  Labs and Imaging: CBC BMET   Recent Labs Lab 11/27/16 1140 11/27/16 1200  WBC 12.3*  --   HGB 10.1* 11.9*  HCT 32.5* 35.0*  PLT 258  --     Recent Labs Lab 11/27/16 1140 11/27/16 1200  NA 117* 120*  K 3.2* 3.4*  CL 78* 78*  CO2 25  --   BUN 9 10  CREATININE 1.46* 1.70*  GLUCOSE 1,171* >700*  CALCIUM 8.7*  --      Dg Shoulder Right  Result Date: 11/26/2016 CLINICAL DATA:  Status post fall, with right shoulder pain. Initial encounter. EXAM: RIGHT SHOULDER - 2+ VIEW COMPARISON:  None. FINDINGS: There is a minimally displaced fracture involving the right humeral head and neck. Degenerative change is noted at the right acromioclavicular joint, with inferior osteophyte formation. Overlying soft tissue swelling is noted. The visualized portions of the right lung are clear. IMPRESSION: Minimally displaced fracture involving the right humeral head and neck. Electronically Signed   By: Roanna Raider M.D.   On: 11/26/2016 22:19   CT head official reading pending  EKG showing sinus tachycardia  Troponin (Point of Care Test)  Recent Labs  11/27/16 1158  TROPIPOC 0.02   ABG    Component Value Date/Time   PHART 7.404 11/27/2016 1422   PCO2ART 44.5 11/27/2016 1422   PO2ART 69.0 (L) 11/27/2016 1422    HCO3 27.8 11/27/2016 1422   TCO2 29 11/27/2016 1422   O2SAT 94.0 11/27/2016 1422   Drugs of Abuse     Component Value Date/Time   LABOPIA POSITIVE (A) 11/27/2016 1150   COCAINSCRNUR NONE DETECTED 11/27/2016 1150   LABBENZ NONE DETECTED 11/27/2016 1150   AMPHETMU NONE DETECTED 11/27/2016 1150   THCU NONE DETECTED 11/27/2016 1150   LABBARB NONE DETECTED 11/27/2016 1150    Alcohol undetectable   Beaulah Dinning, MD 11/27/2016, 2:10 PM PGY-3, [facility] FPTS Intern pager: [phone], text pages welcome

## 2016-11-28 DIAGNOSIS — E1100 Type 2 diabetes mellitus with hyperosmolarity without nonketotic hyperglycemic-hyperosmolar coma (NKHHC): Principal | ICD-10-CM

## 2016-11-28 DIAGNOSIS — S42301D Unspecified fracture of shaft of humerus, right arm, subsequent encounter for fracture with routine healing: Secondary | ICD-10-CM

## 2016-11-28 LAB — BASIC METABOLIC PANEL
ANION GAP: 7 (ref 5–15)
ANION GAP: 7 (ref 5–15)
ANION GAP: 8 (ref 5–15)
CHLORIDE: 100 mmol/L — AB (ref 101–111)
CHLORIDE: 96 mmol/L — AB (ref 101–111)
CHLORIDE: 97 mmol/L — AB (ref 101–111)
CO2: 25 mmol/L (ref 22–32)
CO2: 29 mmol/L (ref 22–32)
CO2: 30 mmol/L (ref 22–32)
Calcium: 8.7 mg/dL — ABNORMAL LOW (ref 8.9–10.3)
Calcium: 8.3 mg/dL — ABNORMAL LOW (ref 8.9–10.3)
Calcium: 8 mg/dL — ABNORMAL LOW (ref 8.9–10.3)
Creatinine, Ser: 0.91 mg/dL (ref 0.44–1.00)
Creatinine, Ser: 0.94 mg/dL (ref 0.44–1.00)
Creatinine, Ser: 0.88 mg/dL (ref 0.44–1.00)
GFR calc Af Amer: >60 mL/min (ref >60)
GFR calc Af Amer: >60 mL/min (ref >60)
GFR calc Af Amer: >60 mL/min (ref >60)
GFR calc non Af Amer: >60 mL/min (ref >60)
GLUCOSE: 129 mg/dL — AB (ref 65–99)
GLUCOSE: 309 mg/dL — AB (ref 65–99)
GLUCOSE: 311 mg/dL — AB (ref 65–99)
POTASSIUM: 2.5 mmol/L — AB (ref 3.5–5.1)
POTASSIUM: 3.6 mmol/L (ref 3.5–5.1)
POTASSIUM: 4 mmol/L (ref 3.5–5.1)
Sodium: 135 mmol/L (ref 135–145)
Sodium: 132 mmol/L — ABNORMAL LOW (ref 135–145)
Sodium: 132 mmol/L — ABNORMAL LOW (ref 135–145)

## 2016-11-28 LAB — GLUCOSE, CAPILLARY
GLUCOSE-CAPILLARY: 239 mg/dL — AB (ref 65–99)
Glucose-Capillary: 136 mg/dL — ABNORMAL HIGH (ref 65–99)
Glucose-Capillary: 273 mg/dL — ABNORMAL HIGH (ref 65–99)
Glucose-Capillary: 314 mg/dL — ABNORMAL HIGH (ref 65–99)
Glucose-Capillary: 345 mg/dL — ABNORMAL HIGH (ref 65–99)
Glucose-Capillary: 374 mg/dL — ABNORMAL HIGH (ref 65–99)

## 2016-11-28 LAB — BASIC METABOLIC PANEL WITH GFR
Anion gap: 7 (ref 5–15)
BUN: 5 mg/dL — ABNORMAL LOW (ref 6–20)
CO2: 25 mmol/L (ref 22–32)
Calcium: 8 mg/dL — ABNORMAL LOW (ref 8.9–10.3)
Chloride: 100 mmol/L — ABNORMAL LOW (ref 101–111)
Creatinine, Ser: 0.98 mg/dL (ref 0.44–1.00)
GFR calc Af Amer: >60 mL/min (ref >60)
GFR calc non Af Amer: >60 mL/min (ref >60)
Glucose, Bld: 340 mg/dL — ABNORMAL HIGH (ref 65–99)
Potassium: 3 mmol/L — ABNORMAL LOW (ref 3.5–5.1)
Sodium: 132 mmol/L — ABNORMAL LOW (ref 135–145)

## 2016-11-28 LAB — HIV ANTIBODY (ROUTINE TESTING W REFLEX): HIV SCREEN 4TH GENERATION: NONREACTIVE

## 2016-11-28 MED ORDER — INSULIN ASPART 100 UNIT/ML ~~LOC~~ SOLN
0.0000 [IU] | Freq: Three times a day (TID) | SUBCUTANEOUS | Status: DC
  Administered 2016-11-29: 11 [IU] via SUBCUTANEOUS
  Administered 2016-11-29: 5 [IU] via SUBCUTANEOUS

## 2016-11-28 MED ORDER — AMLODIPINE BESYLATE 5 MG PO TABS: 5.0000 mg | ORAL_TABLET | Freq: Every day | ORAL | Status: DC

## 2016-11-28 MED ORDER — LOSARTAN POTASSIUM 50 MG PO TABS
50.0000 mg | ORAL_TABLET | Freq: Every day | ORAL | Status: DC
  Administered 2016-11-28 – 2016-11-29 (×2): 50 mg via ORAL
  Filled 2016-11-28 (×2): qty 1

## 2016-11-28 MED ORDER — SODIUM CHLORIDE 0.9 % IV SOLN
INTRAVENOUS | Status: DC
  Administered 2016-11-28 – 2016-11-29 (×2): via INTRAVENOUS

## 2016-11-28 MED ORDER — ACETAMINOPHEN 650 MG RE SUPP: 325.0000 mg | Freq: Four times a day (QID) | RECTAL | Status: DC | PRN

## 2016-11-28 MED ORDER — INSULIN GLARGINE 100 UNIT/ML ~~LOC~~ SOLN
10.0000 [IU] | Freq: Every day | SUBCUTANEOUS | Status: DC
  Administered 2016-11-28: 10 [IU] via SUBCUTANEOUS
  Filled 2016-11-28: qty 0.1

## 2016-11-28 MED ORDER — ACETAMINOPHEN 500 MG PO TABS
500.0000 mg | ORAL_TABLET | Freq: Four times a day (QID) | ORAL | Status: DC | PRN
  Administered 2016-11-28: 500 mg via ORAL
  Filled 2016-11-28: qty 1

## 2016-11-28 MED ORDER — POTASSIUM CHLORIDE 10 MEQ/100ML IV SOLN
10.0000 meq | INTRAVENOUS | Status: AC
  Administered 2016-11-28 (×3): 10 meq via INTRAVENOUS
  Filled 2016-11-28 (×3): qty 100

## 2016-11-28 MED ORDER — SODIUM CHLORIDE 0.9 % IV BOLUS (SEPSIS)
1000.0000 mL | Freq: Once | INTRAVENOUS | Status: AC
  Administered 2016-11-28: 1000 mL via INTRAVENOUS

## 2016-11-28 MED ORDER — HYDROCODONE-ACETAMINOPHEN 5-325 MG PO TABS
1.0000 | ORAL_TABLET | ORAL | Status: DC | PRN
  Administered 2016-11-29 (×4): 1 via ORAL
  Filled 2016-11-28 (×4): qty 1

## 2016-11-28 MED FILL — Hydrocodone-Acetaminophen Tab 5-325 MG: ORAL | Qty: 6 | Status: AC

## 2016-11-28 NOTE — Progress Notes (Addendum)
Critical potassium 2.3 Dr. Jonathon JordanGambino notified  K repleted will continue to monitor

## 2016-11-28 NOTE — Progress Notes (Signed)
Orthopedic Tech Progress Note Patient Details:  Orland JarredSandra Vanderzee 11-Sep-1960 [id number]  Ortho Devices Type of Ortho Device: Arm sling Ortho Device/Splint Location: rue arm elevator sling Ortho Device/Splint Interventions: Application   Saul FordyceJennifer C Alder Murri 11/28/2016, 4:58 PM

## 2016-11-28 NOTE — Progress Notes (Signed)
Family Medicine Teaching Service Daily Progress Note Intern Pager: [phone]  Patient name: Ashley Huff Medical record number: [id number] Date of birth: [birth year] Age: 56 y.o. Gender: female  Primary Care Provider: Patient, [name] Consultants: None Code Status: FULL  Pt Overview and Major Events to Date:  Admitted on 8/12  Assessment and Plan:  Ashley Huff is a 56 y.o. female presenting with change in speech and hyperglycemia . PMH is significant for type 2 diabetes and hypertension.  HHS w/ Hx of T2DM: Altered mental status improved with normalization of glucose, indicating that HHS was likely cause of AMS.  High glucose of 1171 on initial presentation to ED. Normal bicarbonate and no anion gap. ABG with normal pH. UA collected showing greater than 500 glucose but no ketones, no nitrates or leukocytes. Takes insulin at home, unknown doses.  TSH negative. CXR negative.  Osmolality wnl at 287. - heart health/carb mod diet - 100 mL/hr fluid infusion d/t NPO status and dehydration from HHS - CBGs TID with meals and QHS - will continue with 10 u Lantus QHS, will titrate up depending on novolog used in last 24 hours - q 4 BMPs - talk with PCP regarding diabetes regimen; may send patient home with lantus - patient says she takes Lantus 23u QHS and Humalog 13u before meals at home  Hypertension: Unclear she has history of hypertension at home. BP elevated on admission to 195/91, most recent 164.75 -10 mg hydralazine when necessary for systolic blood pressure greater than 180 and diastolic greater than 110 - Add losartan 50 mg daily   Hypokalemia: K 4.0 on 8/13, although as low as 2.5 on 8/12 - supplement with KDUR as needed  Hyponatremia: Sodium of 120 upon initial presentation but 127 when corrected for hyperglycemia.  Sodium of 132 on 8/13. - daily BMP  Acute kidney injury: Unknown baseline creatinine. Creatinine 1.7 on admission. Likely prerenal secondary to volume loss with  uncontrolled hyperglycemia. Status post 1 L.  Creatinine 0.94 on 8/13. -IV fluids as above  Wheezing: Chronic tobacco use, likely has COPD. Unclear if patient has worsening respiratory status on exam today or if this is her baseline. Is unable to tell me if she has inhalers at home. We'll need to rule out respiratory infection. Reassuringly patient afebrile.  CXR negative. -Duo nebs every 6 when necessary  Right closed humeral neck fracture: Found on 8/11. Was told to follow up with ortho in outpatient setting - Holding home Norco in setting of AMS -Spoke with on-call orthopedic Dr. Rayburn Ma about fracture, he said nothing to do an inpatient setting. She will need a right arm sling.  Tobacco Abuse: Smokes 1 pack per day -Tobacco cessation counseling when mental status improves -Can offer nicotine patch if she desires  FEN/GI: Nothing by mouth for now until passes RN swallow screen, fluids per Glucomander protocol Prophylaxis: Lovenox  Disposition: could go home today  Subjective:  [name] is complaining of 8/10 pain due to her right arm fracture, but she says that her pain meds do help.    Objective: Temp:  [97.9 F (36.6 C)-98.6 F (37 C)] 98.1 F (36.7 C) (08/13 0407) Pulse Rate:  [109-116] 116 (08/13 0407) Resp:  [16-27] 25 (08/13 0407) BP: (153-203)/(69-96) 198/90 (08/13 0407) SpO2:  [92 %-100 %] 100 % (08/13 0407) Weight:  [189 lb 6 oz (85.9 kg)-194 lb 14.2 oz (88.4 kg)] 194 lb 14.2 oz (88.4 kg) (08/13 0407) Physical Exam: General: well-nourished woman lying in bed, in NAD  Cardiovascular: tachycardic, regular rhythm Respiratory: expiratory wheezing, on 1.5 L O2 via [place] Abdomen: nontender to palpation, + bowel sounds Extremities: chronic tremor in right arm, diminished strength in right upper extremity, normal strength in other extremities  Laboratory:  Recent Labs Lab 11/27/16 1140 11/27/16 1200  WBC 12.3*  --   HGB 10.1* 11.9*  HCT 32.5* 35.0*  PLT 258  --      Recent Labs Lab 11/27/16 1140  11/27/16 2051 11/28/16 0023 11/28/16 0442  NA 117*  < > 132* 135 132*  K 3.2*  < > 2.3* 2.5* 4.0  CL 78*  < > 93* 97* 96*  CO2 25  --  30 30 29   BUN 9  < > 5* <5* <5*  CREATININE 1.46*  < > 0.95 0.91 0.94  CALCIUM 8.7*  --  8.8* 8.7* 8.3*  PROT 7.2  --   --   --   --   BILITOT 0.3  --   --   --   --   ALKPHOS 289*  --   --   --   --   ALT 16  --   --   --   --   AST 18  --   --   --   --   GLUCOSE 1,171*  < > 229* 129* 309*  < > = values in this interval not displayed.   Imaging/Diagnostic Tests: Ct Head Wo Contrast  Result Date: 11/27/2016 CLINICAL DATA:  Altered mental status. Slurred speech. Left-sided weakness today. EXAM: CT HEAD WITHOUT CONTRAST TECHNIQUE: Contiguous axial images were obtained from the base of the skull through the vertex without intravenous contrast. COMPARISON:  None. FINDINGS: Brain: Global atrophy. There are chronic ischemic changes in the periventricular white matter. Small lacunar infarcts in the left thalamus and basal ganglia. There is encephalomalacia in the right parietal lobe. See image 23 of series 8. No mass effect, midline shift, or acute intracranial hemorrhage. Vascular: No hyperdense vessel or unexpected calcification. Skull: Cranium is intact. Sinuses/Orbits: Mastoid air cells are clear. Visualized paranasal sinuses are clear. The orbits are within normal limits. Other: Noncontributory. IMPRESSION: Chronic ischemic changes and atrophy are noted. No acute intracranial pathology. Electronically Signed   By: Jolaine ClickArthur  Hoss M.D.   On: 11/27/2016 12:46   Dg Chest Portable 1 View  Result Date: 11/27/2016 CLINICAL DATA:  Acute mental status change EXAM: PORTABLE CHEST 1 VIEW COMPARISON:  None. FINDINGS: The heart size and mediastinal contours are within normal limits. Both lungs are clear. The visualized skeletal structures are unremarkable. IMPRESSION: No active disease. Electronically Signed   By: Gerome Samavid  Williams III  M.D   On: 11/27/2016 19:41    Aamira Bischoff, Harlen LabsAmanda C, MD 11/28/2016, 6:40 AM PGY-1, [facility] FPTS Intern pager: [phone], text pages welcome

## 2016-11-28 NOTE — Progress Notes (Signed)
Critical potassium of 2.5 still needs 4 runs of previously ordered potassium, Dr Jonathon JordanGambino made aware will continue to monitor

## 2016-11-28 NOTE — Progress Notes (Signed)
Inpatient Diabetes Program Recommendations  AACE/ADA: New Consensus Statement on Inpatient Glycemic Control (2015)  Target Ranges:  Prepandial:   less than 140 mg/dL      Peak postprandial:   less than 180 mg/dL (1-2 hours)      Critically ill patients:  140 - 180 mg/dL   Lab Results  Component Value Date   GLUCAP 239 (H) 11/28/2016    Review of Glycemic Control  Diabetes history: DM2 Outpatient Diabetes medications:Lantus 23 units qd + Novolog 13 units tid meal coverage Current orders for Inpatient glycemic control: Lantus 10 units + Novolog correction 0-15 units q 4 hrs.  Inpatient Diabetes Program Recommendations:   Increase Lantus to 23 units qd  Novolog 5 units tid meal coverage if eats 50% Change Novolog correction to tid since patient is eating and add 0-5 units hs Will follow.   Thank you, Ashley FischerJudy E. Marliss Buttacavoli, RN, MSN, CDE  Diabetes Coordinator Inpatient Glycemic Control Team Team Pager [phone] (8am-5pm) 11/28/2016 11:22 AM

## 2016-11-29 ENCOUNTER — Encounter (HOSPITAL_COMMUNITY): Payer: Self-pay | Admitting: General Practice

## 2016-11-29 LAB — BASIC METABOLIC PANEL
Anion gap: 9 (ref 5–15)
BUN: 5 mg/dL — ABNORMAL LOW (ref 6–20)
CHLORIDE: 103 mmol/L (ref 101–111)
CO2: 22 mmol/L (ref 22–32)
CREATININE: 0.89 mg/dL (ref 0.44–1.00)
Calcium: 8.2 mg/dL — ABNORMAL LOW (ref 8.9–10.3)
GFR calc non Af Amer: >60 mL/min (ref >60)
Glucose, Bld: 220 mg/dL — ABNORMAL HIGH (ref 65–99)
POTASSIUM: 3.4 mmol/L — AB (ref 3.5–5.1)
Sodium: 134 mmol/L — ABNORMAL LOW (ref 135–145)

## 2016-11-29 LAB — CBC
HEMATOCRIT: 33.1 % — AB (ref 36.0–46.0)
Hemoglobin: 10.3 g/dL — ABNORMAL LOW (ref 12.0–15.0)
MCH: 24.9 pg — AB (ref 26.0–34.0)
MCHC: 31.1 g/dL (ref 30.0–36.0)
MCV: 80 fL (ref 78.0–100.0)
PLATELETS: 252 10*3/uL (ref 150–400)
RBC: 4.14 MIL/uL (ref 3.87–5.11)
RDW: 15.8 % — AB (ref 11.5–15.5)
WBC: 13 10*3/uL — ABNORMAL HIGH (ref 4.0–10.5)

## 2016-11-29 LAB — GLUCOSE, CAPILLARY
GLUCOSE-CAPILLARY: 316 mg/dL — AB (ref 65–99)
Glucose-Capillary: 240 mg/dL — ABNORMAL HIGH (ref 65–99)

## 2016-11-29 LAB — HEMOGLOBIN A1C
HEMOGLOBIN A1C: 15.3 % — AB (ref 4.8–5.6)
Mean Plasma Glucose: 392 mg/dL

## 2016-11-29 MED ORDER — INSULIN GLARGINE 100 UNIT/ML ~~LOC~~ SOLN: 30.0000 [IU] | SUBCUTANEOUS | Status: DC

## 2016-11-29 MED ORDER — INSULIN GLARGINE 100 UNIT/ML ~~LOC~~ SOLN
15.0000 [IU] | Freq: Once | SUBCUTANEOUS | Status: AC
  Administered 2016-11-29: 15 [IU] via SUBCUTANEOUS
  Filled 2016-11-29: qty 0.15

## 2016-11-29 MED ORDER — INSULIN GLARGINE 100 UNIT/ML ~~LOC~~ SOLN: 30.0000 [IU] | SUBCUTANEOUS | 11 refills | Status: AC

## 2016-11-29 MED ORDER — POTASSIUM CHLORIDE CRYS ER 20 MEQ PO TBCR
40.0000 meq | EXTENDED_RELEASE_TABLET | Freq: Two times a day (BID) | ORAL | Status: DC
  Administered 2016-11-29: 40 meq via ORAL
  Filled 2016-11-29: qty 2

## 2016-11-29 MED ORDER — LOSARTAN POTASSIUM 100 MG PO TABS: 100.0000 mg | ORAL_TABLET | Freq: Every day | ORAL | 0 refills | Status: AC

## 2016-11-29 MED ORDER — LOSARTAN POTASSIUM 50 MG PO TABS: 100.0000 mg | ORAL_TABLET | Freq: Every day | ORAL | Status: DC

## 2016-11-29 NOTE — Progress Notes (Signed)
Inpatient Diabetes Program Recommendations  AACE/ADA: New Consensus Statement on Inpatient Glycemic Control (2015)  Target Ranges:  Prepandial:   less than 140 mg/dL      Peak postprandial:   less than 180 mg/dL (1-2 hours)      Critically ill patients:  140 - 180 mg/dL   Lab Results  Component Value Date   GLUCAP 316 (H) 11/29/2016   HGBA1C 15.3 (H) 11/27/2016    Review of Glycemic Control Results for Ashley Huff, Ashley Huff (MRN [id number]) as of 11/29/2016 10:16  Ref. Range 11/28/2016 08:34 11/28/2016 12:48 11/28/2016 16:31 11/28/2016 19:35 11/29/2016 07:22  Glucose-Capillary Latest Ref Range: 65 - 99 mg/dL 578239 (H) 469345 (H) 629374 (H) 273 (H) 316 (H)   Diabetes history: DM2 Outpatient Diabetes medications:Lantus 23 units qd + Novolog 13 units tid meal coverage Current orders for Inpatient glycemic control: Lantus 10 units + Novolog correction 0-15 units q 4 hrs.  CBGs range 239-374. Noted patient may be discharged home today.  Inpatient Diabetes Program Recommendations:   Increase Lantus to 23 units qd  Novolog 5 units tid meal coverage if eats 50% Change Novolog correction to tid since patient is eating and add 0-5 units hs Spoke with Family Medicine Teaching Service regarding recommendations.  Thank you, Billy FischerJudy E. Camri Molloy, RN, MSN, CDE  Diabetes Coordinator Inpatient Glycemic Control Team Team Pager [phone] (8am-5pm) 11/29/2016 10:19 AM

## 2016-11-29 NOTE — Progress Notes (Signed)
Family Medicine Teaching Service Daily Progress Note Intern Pager: [phone]  Patient name: Ashley JarredSandra Fickett Medical record number: [id number] Date of birth: August 05, 1960 Age: 56 y.o. Gender: female  Primary Care Provider: Patient, [name] Consultants: None Code Status: FULL  Pt Overview and Major Events to Date:  Admitted on 8/12  Assessment and Plan:  Ashley Huff is a 56 y.o. female presenting with change in speech and hyperglycemia . PMH is significant for type 2 diabetes and hypertension.  HHS w/ Hx of T2DM: Altered mental status improved with normalization of glucose, indicating that HHS was likely cause of AMS.  High glucose of 1171 on initial presentation to ED. Normal bicarbonate and no anion gap. ABG with normal pH. UA collected showing greater than 500 glucose but no ketones, no nitrates or leukocytes. Takes insulin at home, unknown doses.  TSH negative. CXR negative.  Osmolality wnl at 287. - heart health/carb mod diet - 100 mL/hr fluid infusion d/t NPO status and dehydration from HHS - CBGs TID with meals and QHS - increase lantus 15 units today, increase to 30 units tomorrow --follow SSI, use to titrate meal coverage at discharge - daily BMP - talk with PCP regarding diabetes regimen; may send patient home with lantus - patient says she takes Lantus 23u QHS and Humalog 13u before meals at home  Hypertension: Persistant, despite increase in losartan. Unclear she has history of hypertension at home. BP elevated on admission to 195/91, most recent 164.75 -10 mg hydralazine when necessary for systolic blood pressure greater than 180 and diastolic greater than 110 - increase losartan to 100 mg  Anemia. 11.9 on admit. Pt is tachycardic, possibly due to anemia. No signs of acute bleeding. - trended down toward 10.3 - cont. to trend, f/u - consider FOBT if continues to down trend  Hypokalemia: K 3.4 on 8/14, although as low as 2.5 on 8/12 - supplement with KDUR as  needed  Hyponatremia: improved. Low normal when corrected for hyperglycemia. Sodium of 120 upon initial presentation but 127 when corrected for hyperglycemia.   - daily BMP  Acute kidney injury: Resolved. Unknown baseline creatinine. Creatinine 1.7 on admission. Likely prerenal secondary to volume loss with uncontrolled hyperglycemia.  -IV fluids as above  Wheezing: Chronic tobacco use, likely has COPD. Unclear if patient has worsening respiratory status on exam today or if this is her baseline. Is unable to tell me if she has inhalers at home. We'll need to rule out respiratory infection. Reassuringly patient afebrile.  CXR negative. -Duo nebs every 6 when necessary  Right closed humeral neck fracture: Found on 8/11. Was told to follow up with ortho in outpatient setting - Holding home Norco in setting of AMS -Spoke with on-call orthopedic Dr. Rayburn MaBlackmon about fracture, he said nothing to do an inpatient setting. She will need a right arm sling.  Tobacco Abuse: Smokes 1 pack per day -Tobacco cessation counseling when mental status improves -Can offer nicotine patch if she desires  FEN/GI: Regular diet, fluids per Glucomander protocol Prophylaxis: Lovenox  Disposition: plan d/c tomorrow  Subjective:  Pt is alert and eating breakfast. She endorses that she wants to go home.   Objective: Temp:  [98.2 F (36.8 C)-99 F (37.2 C)] 98.2 F (36.8 C) (08/14 0433) Pulse Rate:  [104-114] 107 (08/14 0433) Resp:  [21-23] 22 (08/14 0433) BP: (110-190)/(51-84) 169/61 (08/14 0433) SpO2:  [99 %-100 %] 100 % (08/13 1859) Physical Exam: General: NAD, lying in bed,  Cardiovascular: tachycardic, regular rhythm, no mrg  Respiratory: CTAB, satting well on room air Abdomen: soft, nondistended, nontender to palpation Extremities: right upper extremity is in a sling 2/2 right humeral neck fracture  Laboratory:  Recent Labs Lab 11/27/16 1140 11/27/16 1200 11/29/16 0232  WBC 12.3*  --   13.0*  HGB 10.1* 11.9* 10.3*  HCT 32.5* 35.0* 33.1*  PLT 258  --  252    Recent Labs Lab 11/27/16 1140  11/28/16 1055 11/28/16 1812 11/29/16 0232  NA 117*  < > 132* 132* 134*  K 3.2*  < > 3.6 3.0* 3.4*  CL 78*  < > 100* 100* 103  CO2 25  < > 25 25 22   BUN 9  < > <5* 5* 5*  CREATININE 1.46*  < > 0.88 0.98 0.89  CALCIUM 8.7*  < > 8.0* 8.0* 8.2*  PROT 7.2  --   --   --   --   BILITOT 0.3  --   --   --   --   ALKPHOS 289*  --   --   --   --   ALT 16  --   --   --   --   AST 18  --   --   --   --   GLUCOSE 1,171*  < > 311* 340* 220*  < > = values in this interval not displayed.   Imaging/Diagnostic Tests: Ct Head Wo Contrast  Result Date: 11/27/2016 CLINICAL DATA:  Altered mental status. Slurred speech. Left-sided weakness today. EXAM: CT HEAD WITHOUT CONTRAST TECHNIQUE: Contiguous axial images were obtained from the base of the skull through the vertex without intravenous contrast. COMPARISON:  None. FINDINGS: Brain: Global atrophy. There are chronic ischemic changes in the periventricular white matter. Small lacunar infarcts in the left thalamus and basal ganglia. There is encephalomalacia in the right parietal lobe. See image 23 of series 8. No mass effect, midline shift, or acute intracranial hemorrhage. Vascular: No hyperdense vessel or unexpected calcification. Skull: Cranium is intact. Sinuses/Orbits: Mastoid air cells are clear. Visualized paranasal sinuses are clear. The orbits are within normal limits. Other: Noncontributory. IMPRESSION: Chronic ischemic changes and atrophy are noted. No acute intracranial pathology. Electronically Signed   By: Jolaine Click M.D.   On: 11/27/2016 12:46   Dg Chest Portable 1 View  Result Date: 11/27/2016 CLINICAL DATA:  Acute mental status change EXAM: PORTABLE CHEST 1 VIEW COMPARISON:  None. FINDINGS: The heart size and mediastinal contours are within normal limits. Both lungs are clear. The visualized skeletal structures are unremarkable.  IMPRESSION: No active disease. Electronically Signed   By: Gerome Sam III M.D   On: 11/27/2016 19:41    Garnette Gunner, MD 11/29/2016, 6:37 AM PGY-1, [facility] Family Medicine FPTS Intern pager: [phone], text pages welcome

## 2016-11-29 NOTE — Discharge Instructions (Signed)
You were hospitalized with elevated blood sugars. Please take careful note of your medication list as some of your medications may have changed. Please schedule a visit for  follow up with your regular doctor to ensure that your condition continues to improve.

## 2016-12-06 ENCOUNTER — Inpatient Hospital Stay: Payer: Medicare Other | Admitting: Internal Medicine

## 2016-12-07 NOTE — Discharge Summary (Signed)
Family Medicine Teaching [facility] Discharge Summary  Patient name: Ashley Huff Medical record number: [id number] Date of birth: 10/01/1960 Age: 56 y.o. Gender: female Date of Admission: 11/27/2016  Date of Discharge: 11/29/2016 Admitting Physician: Ashley Dodrill, MD  Primary Care Provider: Patient, [name] Consultants: None  Indication for Hospitalization: HHS w/ Hx of T2DM  Discharge Diagnoses/Problem List:  -HHS -DM2 -HTN -Anemia -Hypokalemia -Hyponatremia -AKI -Wheezing -Right closed humeral neck fracture -Tobacco abuse  Disposition: Home  Discharge Condition: Stable, improving  Discharge Exam:  General: NAD, lying in bed,  Cardiovascular: tachycardic, regular rhythm, no mrg Respiratory: CTAB, satting well on room air Abdomen: soft, nondistended, nontender to palpation Extremities: right upper extremity is in a sling 2/2 right humeral neck fracture  Brief Hospital Course:  56 yo F with history of diabetes and hypertension, recent proximal humerus fracture presenting with slurred speech and altered mental status, found to be profoundly hyperglycemic with glucose level >1000 on presentation. Not acidotic, labs more suggestive of HHS than DKA. CT head negative for acute process. CBGs were were slowly corrected to 200-300s. Patient clinically improved as glucose was corrected. Orthopedics evaluated her recent arm fracture and to continue using rue arm sling.   Issues for Follow Up:  1. Anemia - Pt was found to be anemic on admission. Hg was 10.4 on discharge with no symptoms of active bleeding. Please recheck CBC and consider further work up of anemia 2. Uncontrolled Hyperglycemia- Patient was discharged home with lantus 30 units a day. Please recheck home CBGs to optimize glucose control.   Significant Procedures: None  Significant Labs and Imaging:  No results for input(s): WBC, HGB, HCT, PLT in the last 168 hours. No results for input(s): NA, K, CL, CO2,  GLUCOSE, BUN, CREATININE, CALCIUM, MG, PHOS, ALKPHOS, AST, ALT, ALBUMIN, PROTEIN in the last 168 hours.  Invalid input(s): TBILI  Results/Tests Pending at Time of Discharge: None  Discharge Medications:  Allergies as of 11/29/2016   No Known Allergies     Medication List    TAKE these medications   busPIRone 15 MG tablet Commonly known as:  BUSPAR Take 15 mg by mouth 3 (three) times daily.   DULoxetine 60 MG capsule Commonly known as:  CYMBALTA Take 60 mg by mouth 2 (two) times daily.   gabapentin 600 MG tablet Commonly known as:  NEURONTIN Take 600 mg by mouth 3 (three) times daily.   HYDROcodone-acetaminophen 5-325 MG tablet Commonly known as:  NORCO/VICODIN Take 1 tablet by mouth every 4 (four) hours as needed.   insulin glargine 100 UNIT/ML injection Commonly known as:  LANTUS Inject 0.3 mLs (30 Units total) into the skin every morning.   losartan 100 MG tablet Commonly known as:  COZAAR Take 1 tablet (100 mg total) by mouth daily.   risperiDONE 0.25 MG tablet Commonly known as:  RISPERDAL Take 0.25 mg by mouth 2 (two) times daily.   rOPINIRole 3 MG tablet Commonly known as:  REQUIP Take 3 mg by mouth at bedtime.            Discharge Care Instructions        Start     Ordered   11/30/16 0000  insulin glargine (LANTUS) 100 UNIT/ML injection  BH-each morning     11/29/16 1502   11/30/16 0000  losartan (COZAAR) 100 MG tablet  Daily     11/29/16 1502      Discharge Instructions: Please refer to Patient Instructions section of EMR for full details.  Patient was counseled important signs and symptoms that should prompt return to medical care, changes in medications, dietary instructions, activity restrictions, and follow up appointments.   Follow-Up Appointments: Follow-up Information    Ashley Huff. Schedule an appointment as soon as possible for a visit in 1 week(s).   Specialty:  Family Medicine Contact information: [facility], [place]  [place] [address]           Ashley Gunner, MD 12/07/2016, 1:29 PM PGY-1, [facility] Family Medicine

## 2018-10-08 IMAGING — CT CT HEAD W/O CM
4 of 5 series · 16 of 47 positions shown, 17 images · non-contrast
Comparison: None.

CLINICAL DATA: Altered mental status. Slurred speech. Left-sided
weakness today.

EXAM:
CT HEAD WITHOUT CONTRAST
TECHNIQUE: Contiguous axial images were obtained from the base of the skull
through the vertex without intravenous contrast.

[Series 4: head without · axial · non-contrast · 0.42mm/px · z∈[-60,+35]mm · 4 of 33 slices shown, 5 images]
[im 7/33  brain]
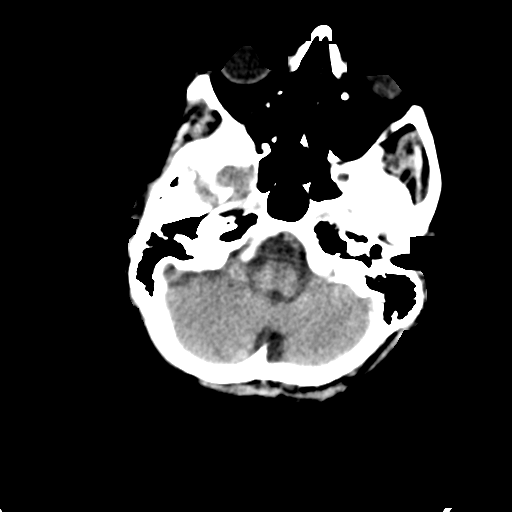
[im 7/33  bone]
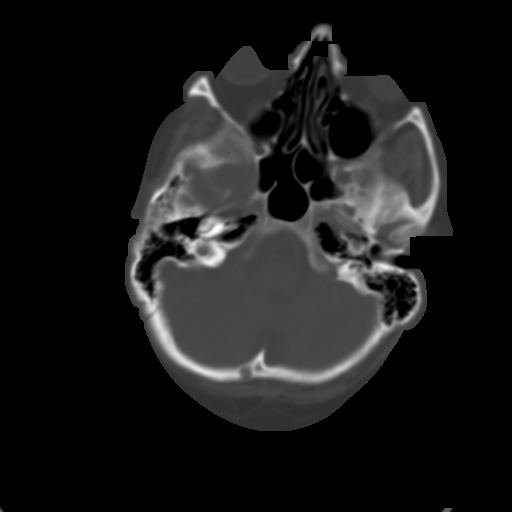
[im 13/33  brain]
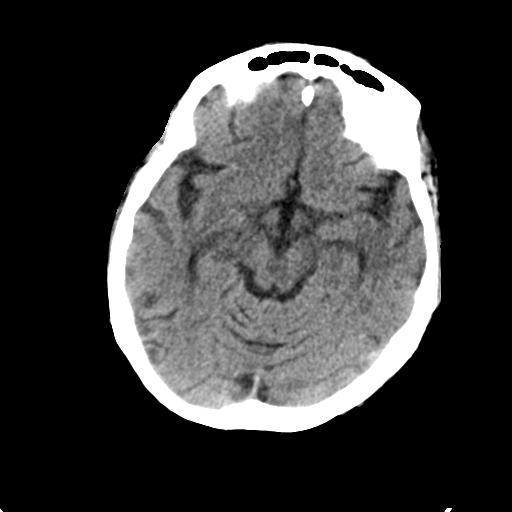
[im 20/33  brain]
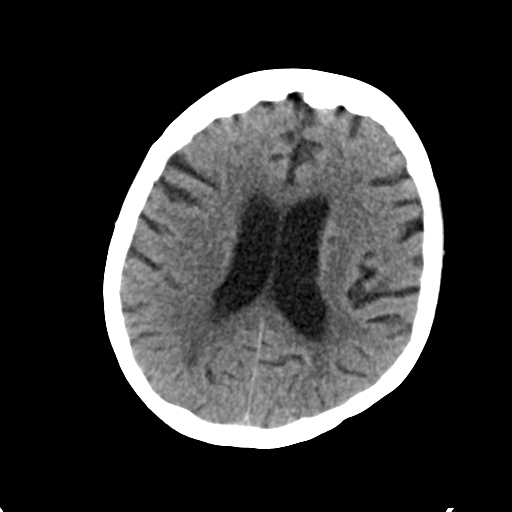
[im 26/33  brain]
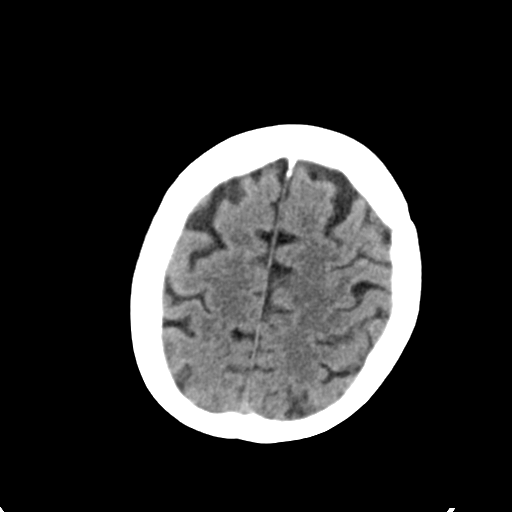

[Series 5: head without cor · coronal · non-contrast · 0.31mm/px · 3 of 58 slices shown]
[im 20/58  brain]
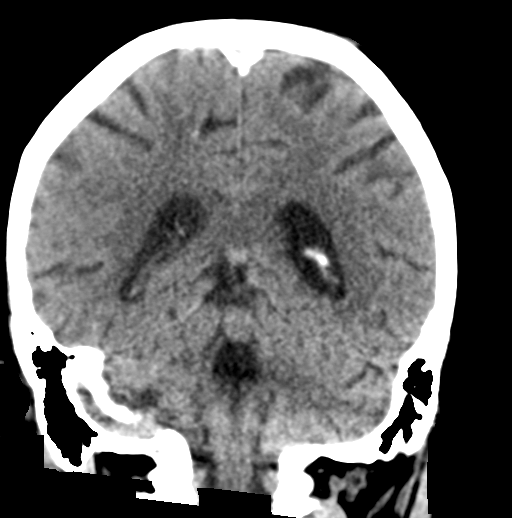
[im 26/58  brain]
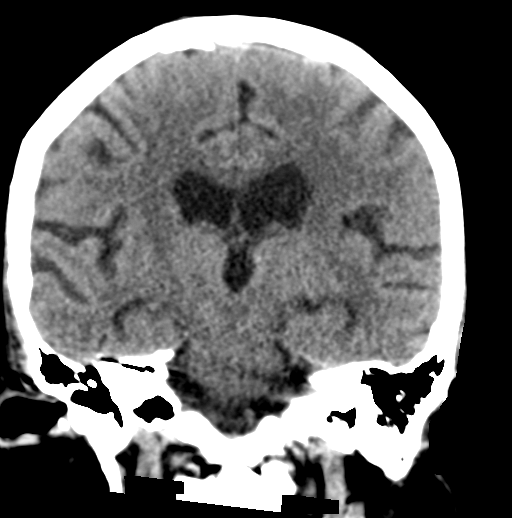
[im 32/58  brain]
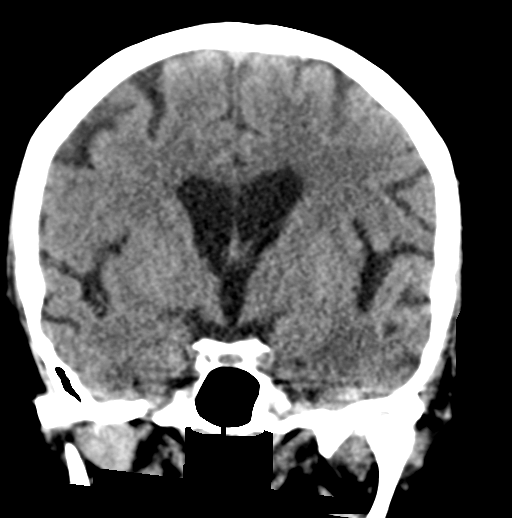

[Series 6: head without sag · sagittal · non-contrast · 0.31mm/px · 3 of 47 slices shown]
[im 16/47  brain]
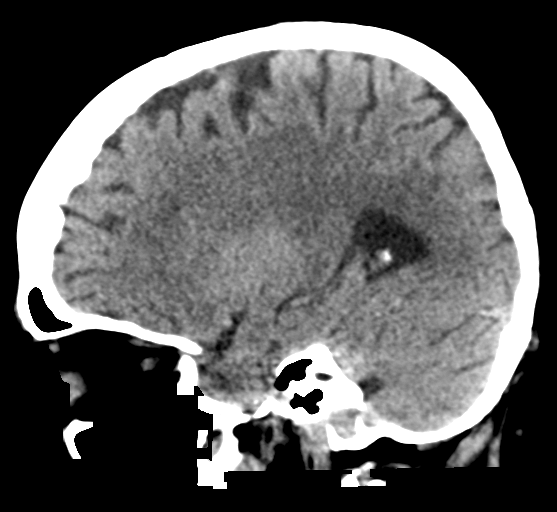
[im 24/47  brain]
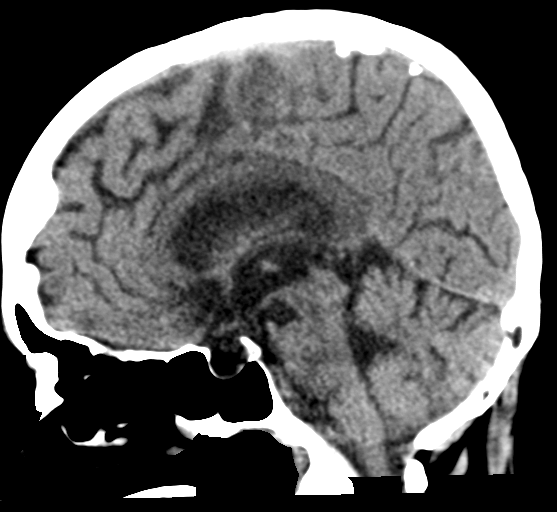
[im 31/47  brain]
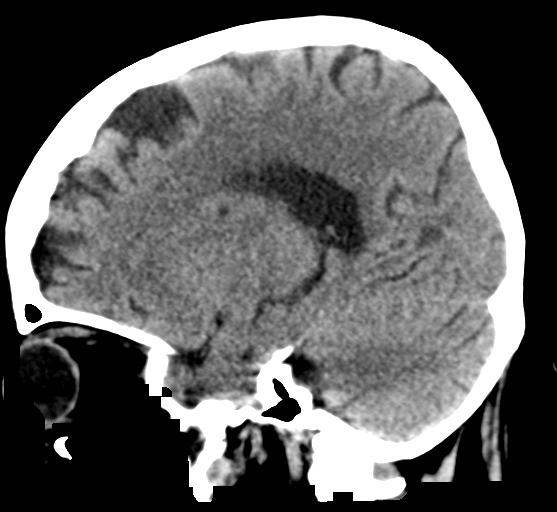

[Series 8: head without ax · axial · non-contrast · 0.32mm/px · z∈[-66,+21]mm · 6 of 48 slices shown]
[im 6/48  brain]
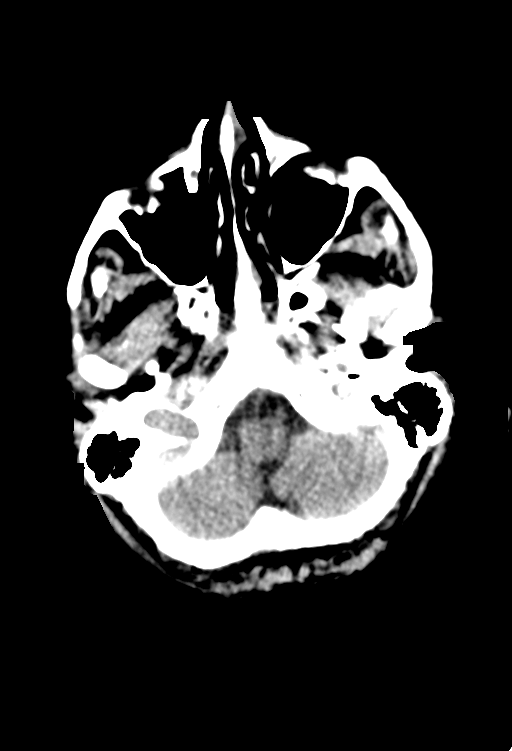
[im 12/48  brain]
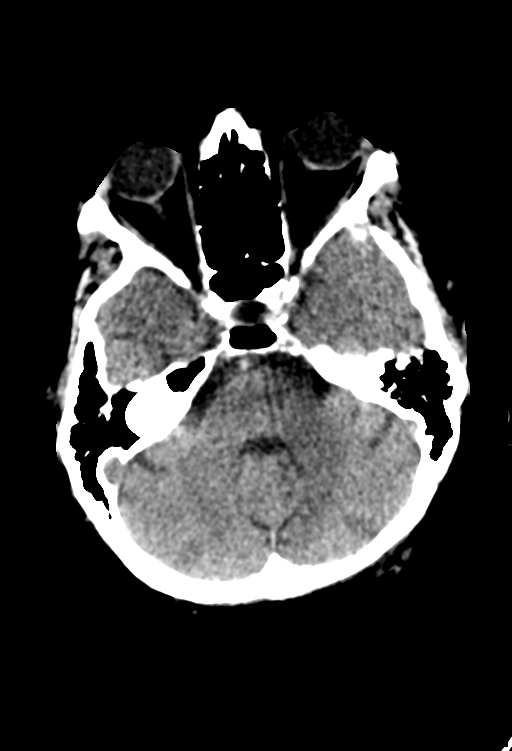
[im 18/48  brain]
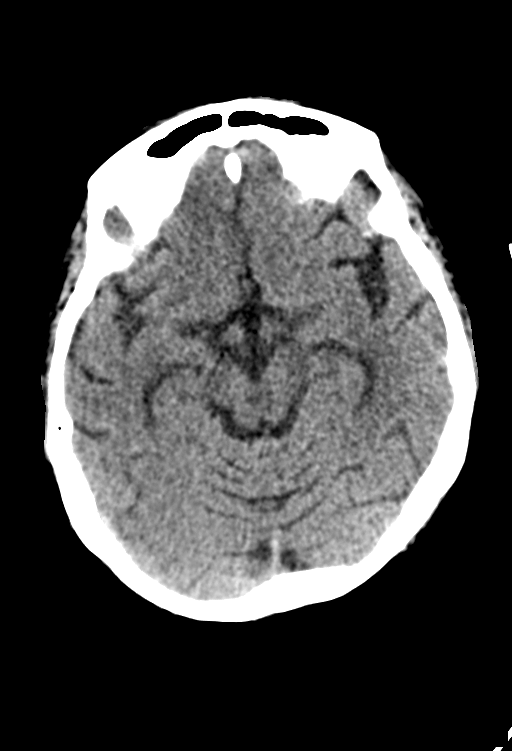
[im 24/48  brain]
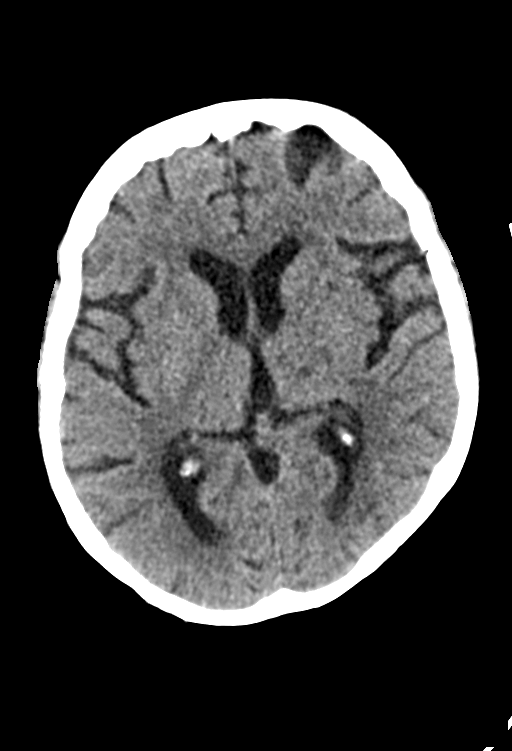
[im 30/48  brain]
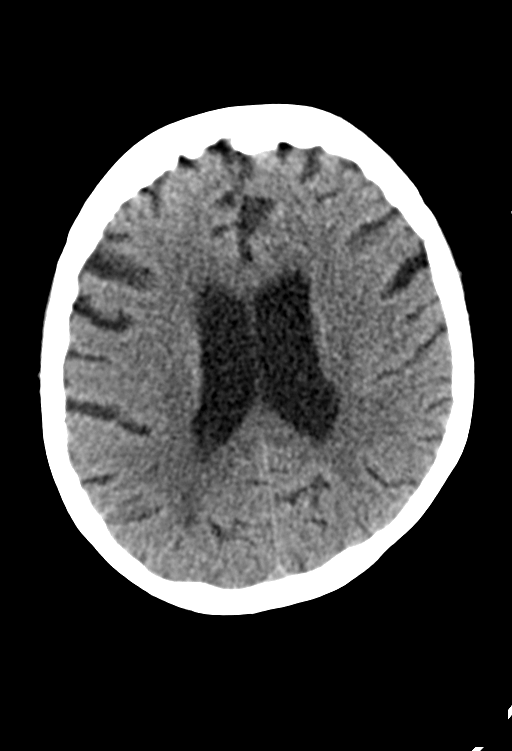
[im 36/48  brain]
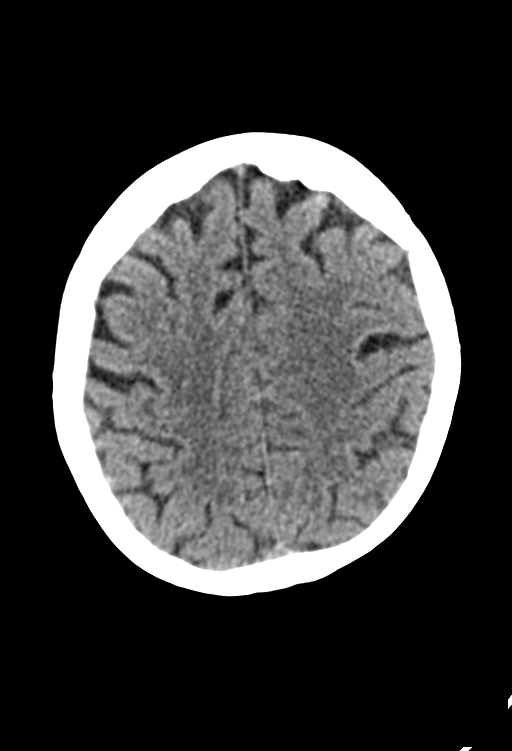

[16 of 47 positions shown; findings below may reference images not displayed]

FINDINGS: Brain: Global atrophy. There are chronic ischemic changes in the
periventricular white matter. Small lacunar infarcts in the left
thalamus and basal ganglia. There is encephalomalacia in the right
parietal lobe. See image 23 of series 8. No mass effect, midline
shift, or acute intracranial hemorrhage.

Vascular: No hyperdense vessel or unexpected calcification.

Skull: Cranium is intact.

Sinuses/Orbits: Mastoid air cells are clear. Visualized paranasal
sinuses are clear. The orbits are within normal limits.

Other: Noncontributory.
IMPRESSION: Chronic ischemic changes and atrophy are noted. No acute
intracranial pathology.

## 2022-12-18 DEATH — deceased
